# Patient Record
Sex: Male | Born: 1990
Health system: Southern US, Community
[De-identification: ages and names within clinical notes are randomized; demographics above are authoritative.]

## PROBLEM LIST (undated history)

## (undated) DIAGNOSIS — T7840XA Allergy, unspecified, initial encounter: Secondary | ICD-10-CM

## (undated) HISTORY — DX: Allergy, unspecified, initial encounter: T78.40XA

---

## 2016-03-17 ENCOUNTER — Emergency Department (HOSPITAL_COMMUNITY): Payer: No Typology Code available for payment source

## 2016-03-17 ENCOUNTER — Emergency Department (HOSPITAL_COMMUNITY)
Admission: EM | Admit: 2016-03-17 | Discharge: 2016-03-17 | Disposition: A | Discharge status: Home / Self Care | Payer: No Typology Code available for payment source | Attending: Emergency Medicine | Admitting: Emergency Medicine

## 2016-03-17 ENCOUNTER — Encounter (HOSPITAL_COMMUNITY): Payer: Self-pay | Admitting: Emergency Medicine

## 2016-03-17 DIAGNOSIS — Y92410 Unspecified street and highway as the place of occurrence of the external cause: Secondary | ICD-10-CM | POA: Diagnosis not present

## 2016-03-17 DIAGNOSIS — Y939 Activity, unspecified: Secondary | ICD-10-CM | POA: Diagnosis not present

## 2016-03-17 DIAGNOSIS — Y999 Unspecified external cause status: Secondary | ICD-10-CM | POA: Insufficient documentation

## 2016-03-17 DIAGNOSIS — S43015A Anterior dislocation of left humerus, initial encounter: Secondary | ICD-10-CM | POA: Diagnosis not present

## 2016-03-17 DIAGNOSIS — S4992XA Unspecified injury of left shoulder and upper arm, initial encounter: Secondary | ICD-10-CM | POA: Diagnosis present

## 2016-03-17 MED ORDER — IBUPROFEN 200 MG PO TABS
600.0000 mg | ORAL_TABLET | Freq: Once | ORAL | Status: AC
  Administered 2016-03-17: 600 mg via ORAL
  Filled 2016-03-17: qty 3

## 2016-03-17 NOTE — ED Notes (Signed)
Bed: WTR5 Expected date:  Expected time:  Means of arrival:  Comments: 
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
## 2016-03-17 NOTE — ED Notes (Signed)
Bed: EX52WA05 Expected date:  Expected time:  Means of arrival:  Comments: Hold for triage 5

## 2017-06-07 ENCOUNTER — Other Ambulatory Visit: Payer: Self-pay

## 2017-06-07 ENCOUNTER — Emergency Department
Admission: EM | Admit: 2017-06-07 | Discharge: 2017-06-07 | Disposition: A | Discharge status: Home / Self Care | Payer: No Typology Code available for payment source | Source: Home / Self Care | Attending: Emergency Medicine | Admitting: Emergency Medicine

## 2017-06-07 ENCOUNTER — Encounter: Payer: Self-pay | Admitting: *Deleted

## 2017-06-07 DIAGNOSIS — R030 Elevated blood-pressure reading, without diagnosis of hypertension: Secondary | ICD-10-CM

## 2017-06-07 DIAGNOSIS — R55 Syncope and collapse: Secondary | ICD-10-CM

## 2017-06-07 LAB — POCT CBC W AUTO DIFF (K'VILLE URGENT CARE)

## 2017-06-07 LAB — TSH: TSH: 2.26 mIU/L (ref 0.40–4.50)

## 2017-06-07 LAB — COMPLETE METABOLIC PANEL WITH GFR
AG RATIO: 2.7 (calc) — AB (ref 1.0–2.5)
ALBUMIN MSPROF: 4.8 g/dL (ref 3.6–5.1)
ALT: 25 U/L (ref 9–46)
AST: 23 U/L (ref 10–40)
Alkaline phosphatase (APISO): 45 U/L (ref 40–115)
BUN: 9 mg/dL (ref 7–25)
CALCIUM: 9.5 mg/dL (ref 8.6–10.3)
CO2: 26 mmol/L (ref 20–32)
CREATININE: 1.07 mg/dL (ref 0.60–1.35)
Chloride: 104 mmol/L (ref 98–110)
GFR, EST AFRICAN AMERICAN: 108 mL/min/{1.73_m2} (ref >=60)
GFR, Est Non African American: 93 mL/min/{1.73_m2} (ref >=60)
GLOBULIN: 1.8 g/dL — AB (ref 1.9–3.7)
Glucose, Bld: 95 mg/dL (ref 65–99)
POTASSIUM: 3.9 mmol/L (ref 3.5–5.3)
SODIUM: 140 mmol/L (ref 135–146)
TOTAL PROTEIN: 6.6 g/dL (ref 6.1–8.1)
Total Bilirubin: 1.3 mg/dL — ABNORMAL HIGH (ref 0.2–1.2)

## 2017-06-07 LAB — POCT FASTING CBG KUC MANUAL ENTRY: POCT Glucose (KUC): 106 mg/dL — AB (ref 70–99)

## 2017-06-07 LAB — MAGNESIUM: Magnesium: 2.1 mg/dL (ref 1.5–2.5)

## 2017-06-07 NOTE — Discharge Instructions (Addendum)
Please make an appointment with a primary care provider. Decrease your use of energy drinks. I will call with results of your other blood test.

## 2017-06-07 NOTE — ED Triage Notes (Signed)
Pt c/o dizziness x 2 days

## 2017-06-07 NOTE — ED Provider Notes (Signed)
[facility]    CSN: [id number] Arrival date & time: 06/07/17  4098     History   Chief Complaint Chief Complaint  Patient presents with  . Dizziness    HPI Susie Pousson is a 27 y.o. male.  Patient states that over the past few months he has had intermittent episodes where he suddenly feels weak, dizzy, and has a trembling sensation.  He has never lost consciousness.  He states he sometimes gets short of breath with these episodes he states sometimes he feels like he is breathing fast but does not have any chest pain or palpitations.  This morning coming home from work he had an episode and so presented to the urgent care for evaluation.  He currently feels some better.  Dizziness  Associated symptoms: shortness of breath     History reviewed. No pertinent past medical history.  There are no active problems to display for this patient.   History reviewed. No pertinent surgical history.     Home Medications    Prior to Admission medications   Not on File    Family History Family History  Problem Relation Age of Onset  . Cancer Father        liver, kidney  . Cancer Paternal Grandfather        leukemia    Social History Social History   Tobacco Use  . Smoking status: Never Smoker  . Smokeless tobacco: Never Used  Substance Use Topics  . Alcohol use: No  . Drug use: Not on file     Allergies   Amoxicillin   Review of Systems Review of Systems  Constitutional: Negative.   HENT: Negative.   Eyes: Negative.   Respiratory: Positive for shortness of breath.   Cardiovascular: Negative.   Gastrointestinal: Negative.   Endocrine: Negative.   Neurological: Positive for dizziness, tremors and light-headedness.  Psychiatric/Behavioral: Negative.      Physical Exam Triage Vital Signs ED Triage Vitals  Enc Vitals Group     BP 06/07/17 0916 (!) 145/91     Pulse Rate 06/07/17 0916 60     Resp 06/07/17 0916 16     Temp 06/07/17 0916 (!)  97.5 F (36.4 C)     Temp Source 06/07/17 0916 Oral     SpO2 06/07/17 0916 99 %     Weight 06/07/17 0919 179 lb (81.2 kg)     Height 06/07/17 0919 5\' 9"  (1.753 m)     Head Circumference --      Peak Flow --      Pain Score 06/07/17 0918 0     Pain Loc --      Pain Edu? --      Excl. in GC? --    No data found.  Updated Vital Signs BP (!) 145/91 (BP Location: Right Arm)   Pulse 60   Temp (!) 97.5 F (36.4 C) (Oral)   Resp 16   Ht 5\' 9"  (1.753 m)   Wt 179 lb (81.2 kg)   SpO2 99%   BMI 26.43 kg/m   Visual Acuity Right Eye Distance:   Left Eye Distance:   Bilateral Distance:    Right Eye Near:   Left Eye Near:    Bilateral Near:     Physical Exam  Constitutional: He is oriented to person, place, and time. He appears well-developed and well-nourished.  HENT:  Head: Normocephalic and atraumatic.  Eyes: Pupils are equal, round, and reactive to light. EOM are normal.  Neck: Normal range of motion. Neck supple.  Cardiovascular: Normal rate and regular rhythm.  Pulmonary/Chest: Effort normal and breath sounds normal.  Abdominal: Soft.  Neurological: He is alert and oriented to person, place, and time. No cranial nerve deficit or sensory deficit. Coordination normal.  Skin: Skin is warm and dry.  Psychiatric: He has a normal mood and affect. His behavior is normal. Judgment and thought content normal.  Labs.  CBC normal.  EKG shows sinus bradycardia no acute changes.  Glucose 106.   UC Treatments / Results  Labs (all labs ordered are listed, but only abnormal results are displayed) Labs Reviewed  POCT FASTING CBG [facility] MANUAL ENTRY - Abnormal; Notable for the following components:      Result Value   POCT Glucose ([facility]) 106 (*)    All other components within normal limits  COMPLETE METABOLIC PANEL WITH GFR  TSH  MAGNESIUM  POCT CBC W AUTO DIFF ([facility])    EKG None Radiology No results found.  Procedures Procedures (including critical care  time)  Medications Ordered in UC Medications - No data to display   Initial Impression / Assessment and Plan / UC Course  I have reviewed the triage vital signs and the nursing notes.  Pertinent labs & imaging results that were available during my care of the patient were reviewed by me and considered in my medical decision making (see chart for details). Patient has multiple issues.  I suspect he may be having panic episodes or some symptoms of alcohol withdrawal since he has been significantly cutting back on alcohol use.  I advised him to follow-up with his primary care physician and he will make an appointment.  I did not put him on medications.  I advised him to not drink so many energy drinks.      Final Clinical Impressions(s) / UC Diagnoses   Final diagnoses:  Near syncope  Elevated blood pressure, situational    ED Discharge Orders    None     Please make an appointment with a primary care provider. Decrease your use of energy drinks. I will call with results of your other blood t  Controlled Substance Prescriptions [place] Controlled Substance Registry consulted? Not Applicable   Collene Gobbleaub, Affan Callow A, MD 06/07/17 1501

## 2017-06-08 ENCOUNTER — Telehealth: Payer: Self-pay

## 2017-06-08 NOTE — Telephone Encounter (Signed)
Pt called back lab results given. He is feeling some better today. advised him to f/u with PCP if he fails to improve.

## 2017-06-08 NOTE — Telephone Encounter (Signed)
Attempted to call patient, and VM not set up.

## 2017-07-24 ENCOUNTER — Other Ambulatory Visit: Payer: Self-pay

## 2017-07-24 ENCOUNTER — Encounter: Payer: Self-pay | Admitting: Physician Assistant

## 2017-07-24 ENCOUNTER — Ambulatory Visit (INDEPENDENT_AMBULATORY_CARE_PROVIDER_SITE_OTHER): Payer: No Typology Code available for payment source | Admitting: Physician Assistant

## 2017-07-24 VITALS — BP 118/64 | HR 63 | Temp 98.2°F | Resp 16 | Ht 68.25 in | Wt 173.8 lb

## 2017-07-24 DIAGNOSIS — F410 Panic disorder [episodic paroxysmal anxiety] without agoraphobia: Secondary | ICD-10-CM

## 2017-07-24 DIAGNOSIS — F1010 Alcohol abuse, uncomplicated: Secondary | ICD-10-CM

## 2017-07-24 MED ORDER — ESCITALOPRAM OXALATE 5 MG PO TABS: 2.5000 mg | ORAL_TABLET | Freq: Every day | ORAL | 0 refills | Status: DC

## 2017-07-24 NOTE — Patient Instructions (Addendum)
  Start the medication at 1/2 tab for one month then come back here so we can talk.  Try to limit your drinking to no more than 2 drinks daily.    IF you received an x-ray today, you will receive an invoice from [facility]. Please contact [facility] at [phone] with questions or concerns regarding your invoice.   IF you received labwork today, you will receive an invoice from [facility]. Please contact [facility] at [phone] with questions or concerns regarding your invoice.   Our billing staff will not be able to assist you with questions regarding bills from these companies.  You will be contacted with the lab results as soon as they are available. The fastest way to get your results is to activate your My Chart account. Instructions are located on the last page of this paperwork. If you have not heard from [place] regarding the results in 2 weeks, please contact this office.

## 2017-07-24 NOTE — Progress Notes (Signed)
07/24/2017 9:36 AM   DOB: 22-Apr-1990 / MRN: [id number]  SUBJECTIVE:  Kristopher Weaver is a 27 y.o. male presenting for an episode of palpitations and anxiousness that occurred this morning.  Patient feels this is alcohol withdrawal.  His last drink was roughly 4 days ago in which he says he had about 10 drinks consecutively.  He denies a history of seizure.  Tells me he was also nauseous.  He was seen for same by Dr. Cleta Alberts at an urgent care roughly 1 month ago.  An EKG and basic blood work was performed and there was no abnormality aside from an elevated bilirubin.  It was felt that the patient was suffering from an anxiety spectrum disorder along with panic.  Patient denies anhedonia and depression today.  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  He denies any illicit drug use.  He is trying to cut back on his drinking.  He is allergic to amoxicillin.   He  has a past medical history of Allergy.    He  reports that he has never smoked. He has never used smokeless tobacco. He reports that he drinks about 2.4 oz of alcohol per week. He reports that he does not use drugs. He  has no sexual activity history on file. The patient  has no past surgical history on file.  His family history includes Cancer in his father, maternal grandfather, and paternal grandfather; Stroke in his paternal grandmother.  Review of Systems  Constitutional: Negative for chills, diaphoresis and fever.  Eyes: Negative.   Respiratory: Negative for cough, hemoptysis, sputum production, shortness of breath and wheezing.   Cardiovascular: Negative for chest pain, orthopnea and leg swelling.  Gastrointestinal: Negative for abdominal pain, blood in stool, constipation, diarrhea, heartburn, melena, nausea and vomiting.  Genitourinary: Negative for flank pain.  Skin: Negative for rash.  Neurological: Negative for dizziness, sensory change, speech change, focal weakness and  headaches.    The problem list and medications were reviewed and updated by myself where necessary and exist elsewhere in the encounter.   OBJECTIVE:  BP 118/64 (BP Location: Right Arm)   Pulse 63   Temp 98.2 F (36.8 C) (Oral)   Resp 16   Ht 5' 8.25" (1.734 m)   Wt 173 lb 12.8 oz (78.8 kg)   SpO2 98%   BMI 26.23 kg/m   Physical Exam  Constitutional: He is oriented to person, place, and time. He appears well-developed. He is active.  Non-toxic appearance. He does not appear ill.  Eyes: Pupils are equal, round, and reactive to light. Conjunctivae and EOM are normal.  Cardiovascular: Normal rate, regular rhythm, S1 normal, S2 normal, normal heart sounds, intact distal pulses and normal pulses. Exam reveals no gallop and no friction rub.  No murmur heard. Pulmonary/Chest: Effort normal. No stridor. No respiratory distress. He has no wheezes. He has no rales.  Abdominal: Soft. Normal appearance and bowel sounds are normal. He exhibits no distension and no mass. There is no tenderness. There is no rigidity, no rebound, no guarding and no CVA tenderness. No hernia.  Musculoskeletal: Normal range of motion. He exhibits no edema.  Neurological: He is alert and oriented to person, place, and time. He has normal strength and normal reflexes. He is not disoriented. No cranial nerve deficit or sensory deficit. He exhibits normal muscle tone. Coordination and gait normal.  Skin: Skin is warm and dry. He is not diaphoretic.  No pallor.  Psychiatric: He has a normal mood and affect. His behavior is normal. Judgment and thought content normal.  Nursing note and vitals reviewed.   No results found for this or any previous visit (from the past 72 hour(s)).  No results found.  ASSESSMENT AND PLAN:  Jered was seen today for alcohol withdrawals.  Diagnoses and all orders for this visit:  Excessive drinking of alcohol -     Hepatic Function Panel -     Renal Function Panel -     TSH -      CBC  Panic: Patient did serve in the [employer] and saw action.  He denies flashbacks as well as triggers.  Normal exam today.  Patient seen by Dr. Cleta Alberts and thought to be physically normal.  Will screen basic lab work and will try him on a low dose SSRI.   -     escitalopram (LEXAPRO) 5 MG tablet; Take 0.5 tablets (2.5 mg total) by mouth daily.    The patient is advised to call or return to clinic if he does not see an improvement in symptoms, or to seek the care of the closest emergency department if he worsens with the above plan.   Deliah Boston, MHS, PA-C Primary Care at [facility] 07/24/2017 9:36 AM

## 2017-07-25 LAB — RENAL FUNCTION PANEL
ALBUMIN: 5.1 g/dL (ref 3.5–5.5)
BUN/Creatinine Ratio: 9 (ref 9–20)
BUN: 9 mg/dL (ref 6–20)
CO2: 24 mmol/L (ref 20–29)
Calcium: 9.8 mg/dL (ref 8.7–10.2)
Chloride: 101 mmol/L (ref 96–106)
Creatinine, Ser: 0.97 mg/dL (ref 0.76–1.27)
GFR calc non Af Amer: 105 mL/min/{1.73_m2} (ref >59)
GFR, EST AFRICAN AMERICAN: 121 mL/min/{1.73_m2} (ref >59)
GLUCOSE: 103 mg/dL — AB (ref 65–99)
PHOSPHORUS: 3.4 mg/dL (ref 2.5–4.5)
POTASSIUM: 4.1 mmol/L (ref 3.5–5.2)
Sodium: 141 mmol/L (ref 134–144)

## 2017-07-25 LAB — CBC
Hematocrit: 50.1 % (ref 37.5–51.0)
Hemoglobin: 17.3 g/dL (ref 13.0–17.7)
MCH: 31 pg (ref 26.6–33.0)
MCHC: 34.5 g/dL (ref 31.5–35.7)
MCV: 90 fL (ref 79–97)
PLATELETS: 221 10*3/uL (ref 150–379)
RBC: 5.58 x10E6/uL (ref 4.14–5.80)
RDW: 12.6 % (ref 12.3–15.4)
WBC: 11.5 10*3/uL — AB (ref 3.4–10.8)

## 2017-07-25 LAB — HEPATIC FUNCTION PANEL
ALT: 36 IU/L (ref 0–44)
AST: 23 IU/L (ref 0–40)
Alkaline Phosphatase: 52 IU/L (ref 39–117)
Bilirubin Total: 0.9 mg/dL (ref 0.0–1.2)
Bilirubin, Direct: 0.21 mg/dL (ref 0.00–0.40)
Total Protein: 7.1 g/dL (ref 6.0–8.5)

## 2017-07-25 LAB — TSH: TSH: 3.26 u[IU]/mL (ref 0.450–4.500)

## 2017-08-26 MED FILL — ESCITALOPRAM 5 MG TABLET: 5 | 60 days supply | Qty: 30 | Fill #0

## 2017-08-30 ENCOUNTER — Ambulatory Visit: Payer: No Typology Code available for payment source | Admitting: Physician Assistant

## 2017-08-31 IMAGING — DX DG SHOULDER 1V*L*
1 series · 1 of 1 positions shown · non-contrast
Comparison: Study obtained earlier in the day

CLINICAL DATA: Post reduction left shoulder dislocation

EXAM:
LEFT SHOULDER - 1 VIEW

[shoulder ap]
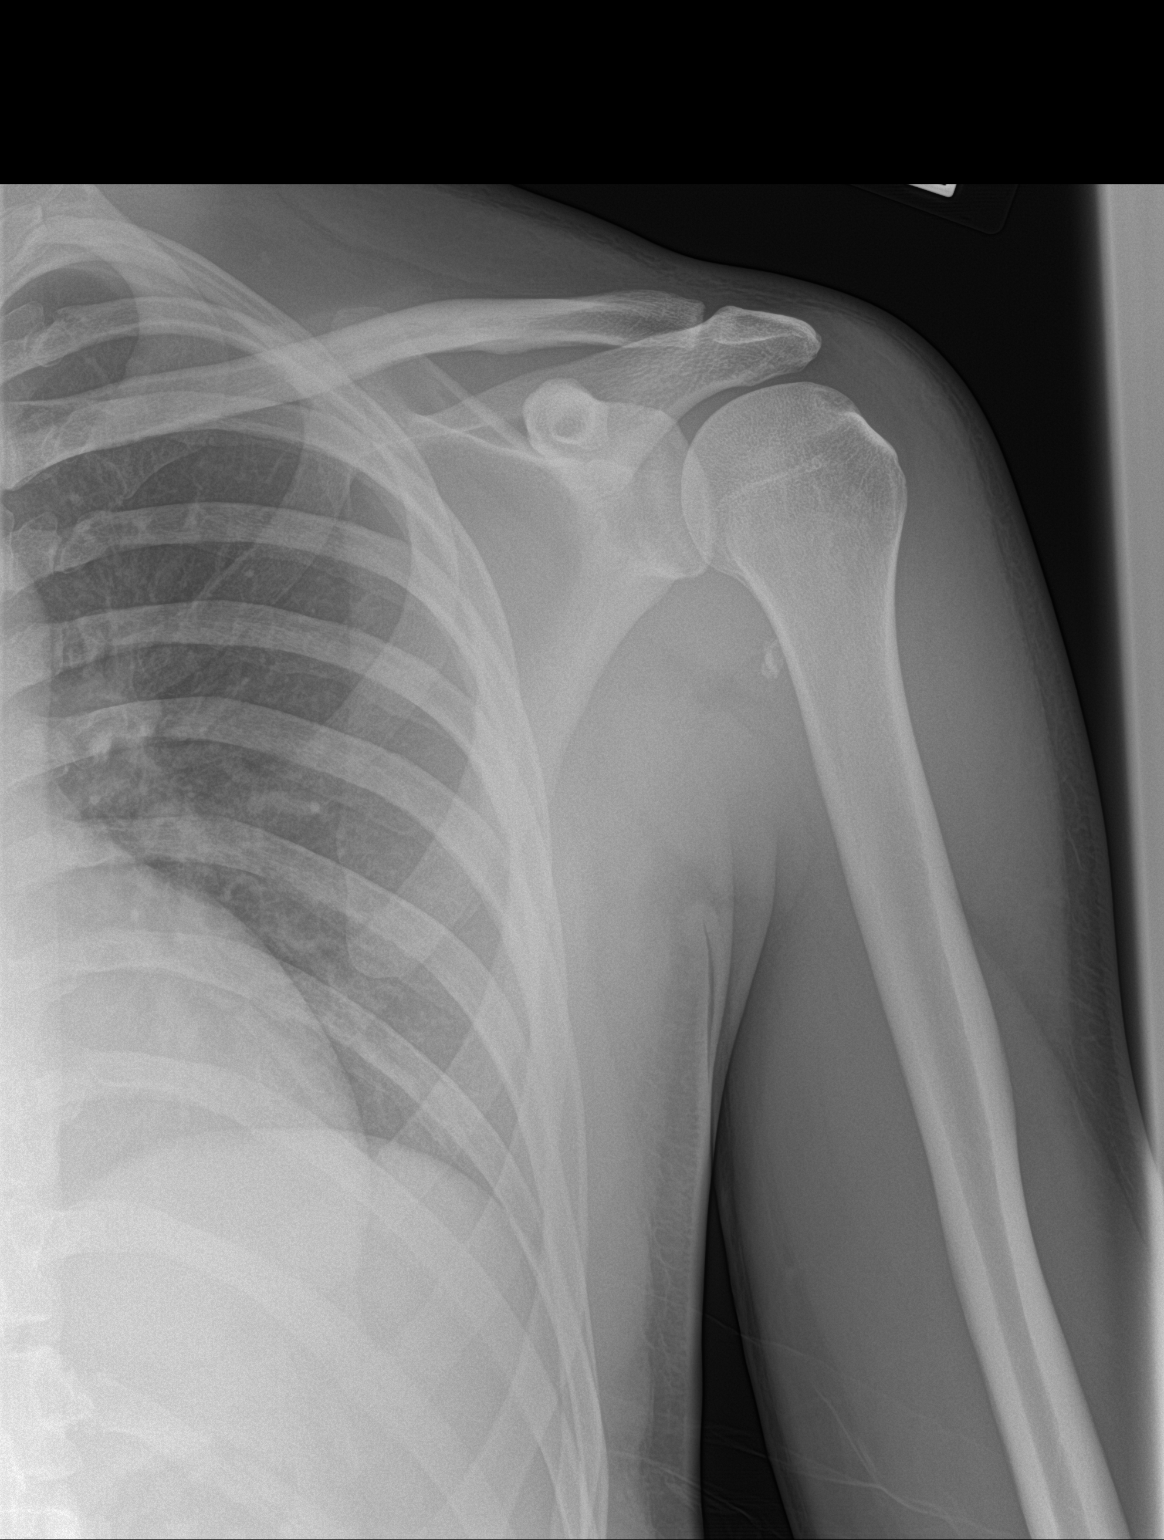

[1 of 1 positions shown; findings below may reference images not displayed]

FINDINGS: Frontal view show were obtained. The previous anterior dislocation
has been reduced successfully. A focal area of calcification is
noted in the left axillary region of uncertain etiology. No fracture
is evident. Joint spaces appear normal.
IMPRESSION: Currently no acute appearing fracture or dislocation. No
arthropathy. Calcification in the left axillary region noted,
possibly a small calcified lymph node.
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
## 2017-09-02 ENCOUNTER — Encounter: Payer: Self-pay | Admitting: Physician Assistant

## 2017-09-02 ENCOUNTER — Other Ambulatory Visit: Payer: Self-pay

## 2017-09-02 ENCOUNTER — Ambulatory Visit (INDEPENDENT_AMBULATORY_CARE_PROVIDER_SITE_OTHER): Payer: No Typology Code available for payment source | Admitting: Physician Assistant

## 2017-09-02 VITALS — BP 116/84 | HR 69 | Temp 98.1°F | Resp 16 | Ht 69.0 in | Wt 177.4 lb

## 2017-09-02 DIAGNOSIS — F411 Generalized anxiety disorder: Secondary | ICD-10-CM | POA: Diagnosis not present

## 2017-09-02 DIAGNOSIS — F410 Panic disorder [episodic paroxysmal anxiety] without agoraphobia: Secondary | ICD-10-CM | POA: Diagnosis not present

## 2017-09-02 MED ORDER — ESCITALOPRAM OXALATE 5 MG PO TABS: 5.0000 mg | ORAL_TABLET | Freq: Every day | ORAL | 1 refills | Status: DC

## 2017-09-02 NOTE — Patient Instructions (Addendum)
Go up to 5 mg daily.  Come back in about 6 months.     IF you received an x-ray today, you will receive an invoice from [facility]. Please contact [facility] at [phone] with questions or concerns regarding your invoice.   IF you received labwork today, you will receive an invoice from [facility]. Please contact [facility] at [phone] with questions or concerns regarding your invoice.   Our billing staff will not be able to assist you with questions regarding bills from these companies.  You will be contacted with the lab results as soon as they are available. The fastest way to get your results is to activate your My Chart account. Instructions are located on the last page of this paperwork. If you have not heard from us regarding the results in 2 weeks, please contact this office.     pb

## 2017-09-02 NOTE — Progress Notes (Signed)
09/02/2017 3:42 PM   DOB: 05/15/1990 / MRN: [id number]  SUBJECTIVE:  Kristopher Weaver is a 27 y.o. male presenting for recheck anxiety and panic. Symptoms present for 3 months.  The problem is improving. He has tried Lexapro 2.5 which he thinks has helped his symptoms.  Patient is also cut down on alcohol consumption and drinks roughly 4-5 normal-sized drinks a week.  He is also reducing caffeine intake at this time and feels this is also helping.  Depression screen PHQ 2/9 09/02/2017  Decreased Interest 0  Down, Depressed, Hopeless 0  PHQ - 2 Score 0     He is allergic to amoxicillin.   He  has a past medical history of Allergy.    He  reports that he has never smoked. He has never used smokeless tobacco. He reports that he drinks about 2.4 oz of alcohol per week. He reports that he does not use drugs. He  has no sexual activity history on file. The patient  has no past surgical history on file.  His family history includes Cancer in his father, maternal grandfather, and paternal grandfather; Stroke in his paternal grandmother.  Review of Systems  Neurological: Negative for dizziness, tingling, tremors, sensory change, speech change, focal weakness, seizures, loss of consciousness, weakness and headaches.  Psychiatric/Behavioral: Negative for depression, hallucinations, memory loss, substance abuse and suicidal ideas. The patient is not nervous/anxious and does not have insomnia.     The problem list and medications were reviewed and updated by myself where necessary and exist elsewhere in the encounter.   OBJECTIVE:  BP 116/84 (BP Location: Left Arm, Patient Position: Sitting, Cuff Size: Normal)   Pulse 69   Temp 98.1 F (36.7 C) (Oral)   Resp 16   Ht 5\' 9"  (1.753 m)   Wt 177 lb 6.4 oz (80.5 kg)   SpO2 99%   BMI 26.20 kg/m   Wt Readings from Last 3 Encounters:  09/02/17 177 lb 6.4 oz (80.5 kg)  07/24/17 173 lb 12.8 oz (78.8 kg)  06/07/17 179 lb (81.2 kg)   Temp Readings  from Last 3 Encounters:  09/02/17 98.1 F (36.7 C) (Oral)  07/24/17 98.2 F (36.8 C) (Oral)  06/07/17 (!) 97.5 F (36.4 C) (Oral)   BP Readings from Last 3 Encounters:  09/02/17 116/84  07/24/17 118/64  06/07/17 (!) 145/91   Pulse Readings from Last 3 Encounters:  09/02/17 69  07/24/17 63  06/07/17 60    Physical Exam  Constitutional: He is oriented to person, place, and time. He appears well-developed. He does not appear ill.  Eyes: Pupils are equal, round, and reactive to light. Conjunctivae and EOM are normal.  Cardiovascular: Normal rate.  Pulmonary/Chest: Effort normal.  Abdominal: He exhibits no distension.  Musculoskeletal: Normal range of motion.  Neurological: He is alert and oriented to person, place, and time. He has normal strength and normal reflexes. He is not disoriented. No cranial nerve deficit or sensory deficit. He exhibits normal muscle tone. Coordination and gait normal.  Skin: Skin is warm and dry. He is not diaphoretic.  Psychiatric: He has a normal mood and affect. His behavior is normal. Judgment and thought content normal.  Nursing note and vitals reviewed.   No results found for: HGBA1C  Lab Results  Component Value Date   WBC 11.5 (H) 07/24/2017   HGB 17.3 07/24/2017   HCT 50.1 07/24/2017   MCV 90 07/24/2017   PLT 221 07/24/2017    Lab Results  Component Value Date   CREATININE 0.97 07/24/2017   BUN 9 07/24/2017   NA 141 07/24/2017   K 4.1 07/24/2017   CL 101 07/24/2017   CO2 24 07/24/2017    Lab Results  Component Value Date   ALT 36 07/24/2017   AST 23 07/24/2017   ALKPHOS 52 07/24/2017   BILITOT 0.9 07/24/2017    Lab Results  Component Value Date   TSH 3.260 07/24/2017    No results found for: CHOL, HDL, LDLCALC, LDLDIRECT, TRIG, CHOLHDL   ASSESSMENT AND PLAN:  [name] was seen today for anxiety.  Diagnoses and all orders for this visit:  Generalized anxiety disorder Comments: Chronic.  Continues to have some  symptoms what sounds like daily.   I am increasing his Lexapro to 5 mg daily.  Panic -     escitalopram (LEXAPRO) 5 MG tablet; Take 1 tablet (5 mg total) by mouth daily.    The patient is advised to call or return to clinic if he does not see an improvement in symptoms, or to seek the care of the closest emergency department if he worsens with the above plan.   Deliah Boston, MHS, PA-C Primary Care at [facility] 09/02/2017 3:42 PM

## 2018-01-03 ENCOUNTER — Other Ambulatory Visit: Payer: Self-pay

## 2018-01-03 ENCOUNTER — Emergency Department (INDEPENDENT_AMBULATORY_CARE_PROVIDER_SITE_OTHER)
Admission: EM | Admit: 2018-01-03 | Discharge: 2018-01-03 | Disposition: A | Discharge status: Home / Self Care | Payer: No Typology Code available for payment source | Source: Home / Self Care

## 2018-01-03 ENCOUNTER — Encounter: Payer: Self-pay | Admitting: *Deleted

## 2018-01-03 DIAGNOSIS — Z23 Encounter for immunization: Secondary | ICD-10-CM

## 2018-01-03 MED ORDER — INFLUENZA VAC SPLIT QUAD 0.5 ML IM SUSY
0.5000 mL | PREFILLED_SYRINGE | INTRAMUSCULAR | Status: AC
  Administered 2018-01-03: 0.5 mL via INTRAMUSCULAR

## 2018-01-03 NOTE — ED Triage Notes (Signed)
The pt is here today for an influenza vaccine.   

## 2018-01-19 ENCOUNTER — Ambulatory Visit: Payer: No Typology Code available for payment source | Admitting: Osteopathic Medicine

## 2019-04-14 ENCOUNTER — Ambulatory Visit: Payer: No Typology Code available for payment source | Attending: Internal Medicine

## 2019-04-14 DIAGNOSIS — Z20822 Contact with and (suspected) exposure to covid-19: Secondary | ICD-10-CM

## 2019-04-15 LAB — NOVEL CORONAVIRUS, NAA: SARS-CoV-2, NAA: NOT DETECTED

## 2019-09-29 ENCOUNTER — Other Ambulatory Visit: Payer: Self-pay

## 2019-09-29 ENCOUNTER — Encounter: Payer: Self-pay | Admitting: Emergency Medicine

## 2019-09-29 ENCOUNTER — Emergency Department (INDEPENDENT_AMBULATORY_CARE_PROVIDER_SITE_OTHER)
Admission: EM | Admit: 2019-09-29 | Discharge: 2019-09-29 | Disposition: A | Discharge status: Home / Self Care | Payer: 59 | Source: Home / Self Care | Attending: Emergency Medicine | Admitting: Emergency Medicine

## 2019-09-29 DIAGNOSIS — J069 Acute upper respiratory infection, unspecified: Secondary | ICD-10-CM

## 2019-09-29 DIAGNOSIS — R059 Cough, unspecified: Secondary | ICD-10-CM

## 2019-09-29 DIAGNOSIS — R6889 Other general symptoms and signs: Secondary | ICD-10-CM | POA: Diagnosis not present

## 2019-09-29 DIAGNOSIS — R509 Fever, unspecified: Secondary | ICD-10-CM

## 2019-09-29 LAB — POC SARS CORONAVIRUS 2 AG -  ED: SARS Coronavirus 2 Ag: NEGATIVE

## 2019-09-29 NOTE — Discharge Instructions (Addendum)
Based on history and physical exam, you most likely have a viral upper respiratory infection.  Because you also have some mild Covid symptoms, we first did in-house Covid test which was negative.  (But the quick test is not always accurate so we will send off the more accurate Covid PCR test.-In the meantime, you need to quarantine, pending the Covid PCR test, which usually takes a couple days to get back.  I am writing a work note. Also, please read instruction sheet on viral respiratory infections.

## 2019-09-29 NOTE — ED Provider Notes (Signed)
[facility]    CSN: [id number] Arrival date & time: 09/29/19  1526      History   Chief Complaint Chief Complaint  Patient presents with  . URI    HPI Kristopher Weaver is a 29 y.o. male.   HPI  Fatigue, mild nonproductive cough, runny nose, fever, sweats, nonfocal headache, mild swollen glands. Onset: 4 days Facial/sinus pressure with clear nasal mucus.    Severity: moderate Tried OTC meds without significant relief.  Symptoms:  + Fever  + URI prodrome with nasal congestion + Minimal swollen neck glands + mild Sinus Headache No ear pressure or pain. No change in taste or smell.  No known Covid exposure.  Has not had Covid vaccine.  No Allergy symptoms No significant Sore Throat No eye symptoms     No chest pain No shortness of breath  No wheezing  No Abdominal Pain No Nausea No Vomiting No diarrhea  No definite myalgias No focal neurologic symptoms No syncope No Rash  No Urinary symptoms  Remainder of Review of Systems negative except as noted in the HPI.   Past Medical History:  Diagnosis Date  . Allergy     There are no problems to display for this patient.   History reviewed. No pertinent surgical history.     Home Medications    Prior to Admission medications   Not on File    Family History Family History  Problem Relation Age of Onset  . Cancer Father        liver, kidney  . Cancer Paternal Grandfather        leukemia  . Cancer Maternal Grandfather        leukemia  . Stroke Paternal Grandmother     Social History Social History   Tobacco Use  . Smoking status: Never Smoker  . Smokeless tobacco: Never Used  Vaping Use  . Vaping Use: Never used  Substance Use Topics  . Alcohol use: Yes    Alcohol/week: 4.0 standard drinks    Types: 4 Cans of beer per week  . Drug use: Never     Allergies   Amoxicillin   Review of Systems Review of Systems   Physical Exam Triage Vital Signs ED Triage Vitals    Enc Vitals Group     BP 09/29/19 1541 (!) 147/87     Pulse Rate 09/29/19 1541 82     Resp 09/29/19 1541 20     Temp 09/29/19 1541 98.1 F (36.7 C)     Temp Source 09/29/19 1541 Oral     SpO2 09/29/19 1541 98 %     Weight 09/29/19 1542 180 lb (81.6 kg)     Height 09/29/19 1542 5\' 10"  (1.778 m)     Head Circumference --      Peak Flow --      Pain Score 09/29/19 1541 0     Pain Loc --      Pain Edu? --      Excl. in GC? --    No data found.  Updated Vital Signs BP (!) 147/87 (BP Location: Right Arm)   Pulse 82   Temp 98.1 F (36.7 C) (Oral)   Resp 20   Ht 5\' 10"  (1.778 m)   Wt 81.6 kg   SpO2 98%   BMI 25.83 kg/m    Physical Exam Vitals and nursing note reviewed.  Constitutional:      General: He is not in acute distress.    Appearance:  He is well-developed. He is not ill-appearing, toxic-appearing or diaphoretic.  HENT:     Head: Normocephalic and atraumatic.     Right Ear: Tympanic membrane, ear canal and external ear normal.     Left Ear: Tympanic membrane, ear canal and external ear normal.     Nose: Mucosal edema, congestion (Mild) and rhinorrhea (Serous) present.     Right Sinus: Maxillary sinus tenderness (Minimal) present.     Left Sinus: Maxillary sinus tenderness (Minimal) present.     Mouth/Throat:     Mouth: Mucous membranes are moist. No oral lesions.     Pharynx: No pharyngeal swelling, oropharyngeal exudate or posterior oropharyngeal erythema.     Tonsils: No tonsillar exudate.  Eyes:     General: No scleral icterus.       Right eye: No discharge.        Left eye: No discharge.  Cardiovascular:     Rate and Rhythm: Normal rate and regular rhythm.     Heart sounds: Normal heart sounds.  Pulmonary:     Effort: Pulmonary effort is normal.     Breath sounds: Normal breath sounds. No wheezing or rales.  Musculoskeletal:     Cervical back: Neck supple.  Lymphadenopathy:     Cervical: No cervical adenopathy.  Skin:    General: Skin is warm and  dry.     Capillary Refill: Capillary refill takes less than 2 seconds.     Comments: No rash  Neurological:     Mental Status: He is alert and oriented to person, place, and time.      UC Treatments / Results  Labs (all labs ordered are listed, but only abnormal results are displayed) Labs Reviewed  SARS-COV-2 RNA,(COVID-19) QUALITATIVE NAAT  POC SARS CORONAVIRUS 2 AG -  ED    EKG   Radiology No results found.  Procedures Procedures (including critical care time)  Medications Ordered in UC Medications - No data to display  Initial Impression / Assessment and Plan / UC Course  I have reviewed the triage vital signs and the nursing notes.  Pertinent labs & imaging results that were available during my care of the patient were reviewed by me and considered in my medical decision making (see chart for details).     Final Clinical Impressions(s) / UC Diagnoses   Final diagnoses:  Flu-like symptoms  Cough  Febrile illness, acute  Viral URI with cough     Discharge Instructions     Based on history and physical exam, you most likely have a viral upper respiratory infection.  Because you also have some mild Covid symptoms, we first did in-house Covid test which was negative.  (But the quick test is not always accurate so we will send off the more accurate Covid PCR test.-In the meantime, you need to quarantine, pending the Covid PCR test, which usually takes a couple days to get back.  I am writing a work note. Also, please read instruction sheet on viral respiratory infections.    Precautions discussed. Red flags discussed. Questions invited and answered. Patient voiced understanding and agreement.    Lajean Manes, MD 10/04/19 1131

## 2019-09-29 NOTE — ED Triage Notes (Signed)
Fatigue, cough, runny nose,fever, sweats, headache, swollen glands x 4 days

## 2019-09-30 LAB — SARS-COV-2 RNA,(COVID-19) QUALITATIVE NAAT: SARS CoV2 RNA: NOT DETECTED
